# Patient Record
Sex: Female | Born: 1960 | Race: Asian | Hispanic: No | Marital: Married | Smoking: Never smoker
Health system: Southern US, Community
[De-identification: ages and names within clinical notes are randomized; demographics above are authoritative.]

## PROBLEM LIST (undated history)

## (undated) DIAGNOSIS — E785 Hyperlipidemia, unspecified: Secondary | ICD-10-CM

## (undated) HISTORY — DX: Hyperlipidemia, unspecified: E78.5

## (undated) HISTORY — PX: DILATION AND CURETTAGE, DIAGNOSTIC / THERAPEUTIC: SUR384

---

## 2014-01-14 DIAGNOSIS — N8501 Benign endometrial hyperplasia: Secondary | ICD-10-CM | POA: Insufficient documentation

## 2019-11-03 DIAGNOSIS — E559 Vitamin D deficiency, unspecified: Secondary | ICD-10-CM | POA: Insufficient documentation

## 2019-11-03 DIAGNOSIS — Z823 Family history of stroke: Secondary | ICD-10-CM | POA: Insufficient documentation

## 2019-11-03 DIAGNOSIS — E782 Mixed hyperlipidemia: Secondary | ICD-10-CM | POA: Insufficient documentation

## 2020-11-21 ENCOUNTER — Other Ambulatory Visit: Payer: Self-pay | Admitting: Internal Medicine

## 2020-11-21 DIAGNOSIS — R140 Abdominal distension (gaseous): Secondary | ICD-10-CM

## 2020-12-11 ENCOUNTER — Telehealth: Payer: Self-pay

## 2020-12-11 NOTE — Telephone Encounter (Signed)
Call placed to [facility] to obtain images/report prior to upcoming appointment. They have reported that their system is down with no estimated time on when it will be back up. At this time they cannot powershare/create a disc or provide a paper report. They can only get a radiologist to call a report.

## 2020-12-13 ENCOUNTER — Inpatient Hospital Stay: Payer: BC Managed Care – PPO

## 2020-12-13 ENCOUNTER — Other Ambulatory Visit: Payer: Self-pay

## 2020-12-13 ENCOUNTER — Inpatient Hospital Stay: Payer: BC Managed Care – PPO | Attending: Obstetrics and Gynecology | Admitting: Obstetrics and Gynecology

## 2020-12-13 VITALS — BP 125/80 | HR 71 | Temp 98.9°F | Resp 20 | Wt 189.4 lb

## 2020-12-13 DIAGNOSIS — R1900 Intra-abdominal and pelvic swelling, mass and lump, unspecified site: Secondary | ICD-10-CM

## 2020-12-13 DIAGNOSIS — R140 Abdominal distension (gaseous): Secondary | ICD-10-CM | POA: Insufficient documentation

## 2020-12-13 DIAGNOSIS — R971 Elevated cancer antigen 125 [CA 125]: Secondary | ICD-10-CM

## 2020-12-13 DIAGNOSIS — R1032 Left lower quadrant pain: Secondary | ICD-10-CM | POA: Diagnosis not present

## 2020-12-13 DIAGNOSIS — Z01818 Encounter for other preprocedural examination: Secondary | ICD-10-CM | POA: Insufficient documentation

## 2020-12-13 DIAGNOSIS — D3912 Neoplasm of uncertain behavior of left ovary: Secondary | ICD-10-CM | POA: Diagnosis present

## 2020-12-13 LAB — COMPREHENSIVE METABOLIC PANEL
ALT: 18 U/L (ref 0–44)
AST: 20 U/L (ref 15–41)
Albumin: 3.7 g/dL (ref 3.5–5.0)
Alkaline Phosphatase: 64 U/L (ref 38–126)
Anion gap: 8 (ref 5–15)
BUN: 11 mg/dL (ref 6–20)
CO2: 26 mmol/L (ref 22–32)
Calcium: 8.7 mg/dL — ABNORMAL LOW (ref 8.9–10.3)
Chloride: 106 mmol/L (ref 98–111)
Creatinine, Ser: 0.65 mg/dL (ref 0.44–1.00)
GFR, Estimated: >60 mL/min (ref >60)
Glucose, Bld: 86 mg/dL (ref 70–99)
Potassium: 4.2 mmol/L (ref 3.5–5.1)
Sodium: 140 mmol/L (ref 135–145)
Total Bilirubin: 0.8 mg/dL (ref 0.3–1.2)
Total Protein: 6.8 g/dL (ref 6.5–8.1)

## 2020-12-13 LAB — CBC WITH DIFFERENTIAL/PLATELET
Abs Immature Granulocytes: 0 10*3/uL (ref 0.00–0.07)
Basophils Absolute: 0 10*3/uL (ref 0.0–0.1)
Basophils Relative: 1 %
Eosinophils Absolute: 0.3 10*3/uL (ref 0.0–0.5)
Eosinophils Relative: 6 %
HCT: 41.6 % (ref 36.0–46.0)
Hemoglobin: 13.9 g/dL (ref 12.0–15.0)
Immature Granulocytes: 0 %
Lymphocytes Relative: 35 %
Lymphs Abs: 1.7 10*3/uL (ref 0.7–4.0)
MCH: 30.3 pg (ref 26.0–34.0)
MCHC: 33.4 g/dL (ref 30.0–36.0)
MCV: 90.8 fL (ref 80.0–100.0)
Monocytes Absolute: 0.4 10*3/uL (ref 0.1–1.0)
Monocytes Relative: 7 %
Neutro Abs: 2.6 10*3/uL (ref 1.7–7.7)
Neutrophils Relative %: 51 %
Platelets: 264 10*3/uL (ref 150–400)
RBC: 4.58 MIL/uL (ref 3.87–5.11)
RDW: 12.3 % (ref 11.5–15.5)
WBC: 5 10*3/uL (ref 4.0–10.5)
nRBC: 0 % (ref 0.0–0.2)

## 2020-12-13 NOTE — Progress Notes (Signed)
Called and spoke with [facility] again. They are still unable to powershare  CT or transfer images to disc. They are attempting to obtain a paper report. If this is successful they will fax to cancer center.

## 2020-12-13 NOTE — Patient Instructions (Signed)
[facility] will reach out to you with appointments regarding surgery

## 2020-12-13 NOTE — Progress Notes (Signed)
Gynecologic Oncology Consult Visit   Referring Provider: Rusty Aus, MD [facility] [place],  [place] [address] [phone]  Chief Concern: abdominal pain and ovarian mass  Subjective:  Sophia Stewart is a 60 y.o. P0 female who is seen in consultation from Dr. Sabra Heck for large abdominal pain and ovarian mass concerning for malignancy.  Sophia Stewart presented with a 6 month history of LLQ/suprapubic abdominal pain and known sigmoid diverticulosis to the GI clinic in 12/04/2020 for chief complaint of colon cancer screening, abdominal distention, and generalized abdominal pain. She reports she began having abdominal pain located in LLQ and suprapubic regions back in October which felt like menstrual cramps to her. She was evaluated by Nani Gasser in office 07/17/20 where she was empirically treated for diverticulitis with course of Augmentin 875 mg PO BID x10d. She reports abdominal pain symptoms resolved after antibiotics. She had recurrence of her lower abdominal pain (LLQ and suprapubic regions) last week and was seen by Dr. Sabra Heck 11/21/2020  where she was restarted on Augmentin and was scheduled for CT scan. She reports her abdominal pain has improved since starting antibiotics, but she also c/o abdominal distention and tightness. Of note she reports that 5 years ago patient was back home in [place] for abdominal pain where pelvic ultrasound reportedly showed "overgrowth on uterus." This was not followed up. She had one episode of vaginal bleeding several months ago but no bleeding since.  Pertinent positive symptoms include weight loss and cough; rectal bleeding and perirectal pruritus which she correlates with hemorrhoid symptoms. The rectal bleeding was just seen on the tissue paper when wiping. Last colonoscopy 08/2011 performed by Dr. Gustavo Lah showed sigmoid diverticulosis, 1 mm sessile polyp in ascending colon with path showing unremarkable colonic mucosa.     11/21/2020 - CXR obtained to evaluate cough and was negative  2/23?/2022 - exact date unknown. Verbal report and hand-written note only. Final report not available.  CT Abdomen and pelvis: Large abdominal cystic mass 31 cm concerning for cystic adenoma or cystic carcinoma. Solid masses either related to uterine or cystic tumors.   12/13/2020 - In-office pelvic US by Dr. Georga Bora, gynecology. She provided hand-written notes. Left ovary cystic mass multiple measurements one >10 cm. Right ovary hazy. EMS not given.   Tumor markers not obtained.   She presents today for evaluation.     Problem List: Patient Active Problem List   Diagnosis Date Noted  . Family history of stroke 11/03/2019  . Hyperlipidemia, mixed 11/03/2019  . Vitamin D deficiency 11/03/2019  . Endometrial hyperplasia without atypia, complex 01/14/2014    Past Medical History: Past Medical History:  Diagnosis Date  . Hyperlipidemia         Past Surgical History: Past Surgical History:  Procedure Laterality Date  . DILATION AND CURETTAGE, DIAGNOSTIC / THERAPEUTIC       Past Gynecologic History:  Post menopausal History of Abnormal pap: No Last pap: 01/2014 NILM/HRHPV negative  OB History:  OB History  Gravida Para Term Preterm AB Living  1 0     1 0  SAB IAB Ectopic Multiple Live Births               # Outcome Date GA Lbr Len/2nd Weight Sex Delivery Anes PTL Lv  1 AB             Family History: Family History  Problem Relation Age of Onset  . Stroke Mother   . Diabetes Mother   .  Diabetes Sister      Social History: Social History   Socioeconomic History  . Marital status: Married    Spouse name: Not on file  . Number of children: Not on file  . Years of education: Not on file  . Highest education level: Not on file  Occupational History  . Not on file  Tobacco Use  . Smoking status: Never Smoker  . Smokeless tobacco: Never Used  Vaping Use  . Vaping Use: Never used   Substance and Sexual Activity  . Alcohol use: Not Currently  . Drug use: Not Currently  . Sexual activity: Not Currently  Other Topics Concern  . Not on file  Social History Narrative  . Not on file   Social Determinants of Health   Financial Resource Strain: Not on file  Food Insecurity: Not on file  Transportation Needs: Not on file  Physical Activity: Not on file  Stress: Not on file  Social Connections: Not on file  Intimate Partner Violence: Not on file    Allergies: No Known Allergies  Current Medications: Current Outpatient Medications  Medication Sig Dispense Refill  . Lutein 6 MG CAPS Take 1 capsule by mouth daily.    Marland Kitchen PROCTO-MED HC 2.5 % rectal cream Apply topically 3 (three) times daily.     No current facility-administered medications for this visit.    Review of Systems General: weight loss o/w negative for fevers, or night sweats Skin: negative for changes in moles or sores or rash Eyes: negative for changes in vision HEENT: negative for change in hearing, tinnitus, voice changes Pulmonary: cough o/w negative for dyspnea, orthopnea, wheezing Cardiac: negative for palpitations, pain Gastrointestinal: narrowed stool caliber but sometime more normal stool; occasional BRBPR, o/w negative for nausea, vomiting, constipation, diarrhea, hematemesis Genitourinary/Sexual: urinary frequency o/w negative for dysuria, retention, hematuria, incontinence Ob/Gyn:  negative for abnormal bleeding, or pain Musculoskeletal: negative for pain, joint pain, back pain Hematology: negative for easy bruising, abnormal bleeding Neurologic/Psych: negative for headaches, seizures, paralysis, weakness, numbness  Objective:  Physical Examination:  BP 125/80   Pulse 71   Temp 98.9 F (37.2 C)   Resp 20   Wt 189 lb 6.4 oz (85.9 kg)   SpO2 100%    ECOG Performance Status: 1 - Symptomatic but completely ambulatory  GENERAL: Patient is a well appearing female in no acute  distress. Accompanied by husband. HEENT:  PERRL, neck supple with midline trachea. Thyroid without masses.  NODES:  No cervical, supraclavicular, axillary, or inguinal lymphadenopathy palpated.  LUNGS:  Clear to auscultation bilaterally.  No wheezes or rhonchi. HEART:  Regular rate and rhythm. No murmur appreciated. ABDOMEN:  Distended with firm nontender mass filling the pelvis and extending into the upper abdomen > 30 cm. No mobility.    MSK:  No focal spinal tenderness to palpation. Full range of motion bilaterally in the upper extremities. EXTREMITIES:  No peripheral edema.   SKIN:  Clear with no obvious rashes or skin changes.  NEURO:  Nonfocal. Well oriented.  Appropriate affect.  Pelvic: exam chaperoned by nurse;  Vulva: normal appearing vulva with no masses, tenderness or lesions; Vagina: normal vagina; Sophia Stewart: large mass filling the Sophia Stewart, L>R, smooth, fixed, no nodularity, nontender. Uterus: uterus is anteverted normal size, shape, consistency and nontender; Cervix: no lesions; Rectal: + hemorrhoids; confirmatory, no mucosal involvement.     Lab Review Labs on site today: CBC/CMP/CEA/CA125 ordered 11/16/2020  Vitamin B12  413 - normal TSH 3.548 - normal HbA1C - 5.5  normal           Radiologic Imaging: As per HPI. CT scan images and results requested    Assessment:  Amelda Hapke is a 60 y.o. female diagnosed with symptomatic large abdominal mass worrisome for ovarian malignancy. Differential diagnosis includes GI malignancy, endometrial cancer, or benign ovarian etiology.   History of thickened endometrial stripe, no recent symptoms of abnormal vaginal bleeding, [place] completed and results pending to assess EMS.   Sigmoid diverticulosis based on colonoscopy. Question of diverticulitis flares which has responded well to oral Augmentin antimicrobial therapy. There is no history of CT-confirmed episodes of diverticulitis.    Medical co-morbidities complicating care:  None  Plan:   Problem List Items Addressed This Visit   None   Visit Diagnoses    Pelvic mass    -  Primary   Relevant Orders   CA 125   CEA   Comprehensive metabolic panel   CBC with Differential/Platelet   IGP, Aptima HPV   EKG 12-Lead     We discussed options for management options and recommended surgery at [facility]. We reviewed that if she has malignancy recommendation for TH and BSO with staging. If nonmalignant hysterectomy does not need to be performed. She may need bowel surgery given symptoms and exam and we discussed possibly ostomy. We will assess surgical approach after review of CT images. If mostly cystic we can try controlled drainage with hand-assisted laparoscopy versus laparotomy. Given the immobility of the mass suspect laparotomy may be the approach.  Follow up pelvic US findings and if EMS thickened consider EMBx at her preop visit at [facility].     Try to obtain final reports for CT scan and pelvic US. Obtain EKG, CBC, CMP, CA125 and CEA.   The patient's diagnosis, an outline of the further diagnostic and laboratory studies which will be required, the recommendation, and alternatives were discussed.  All questions were answered to the patient's satisfaction.  A total of .120 minutes were spent with the patient/family today; >50% was spent in education, counseling and coordination of care for large pelvic mass.   [place], MD    CC:  Rusty Aus, MD [facility] [address] [place],  [place] [address] [phone]

## 2020-12-14 ENCOUNTER — Telehealth: Payer: Self-pay | Admitting: Obstetrics and Gynecology

## 2020-12-14 LAB — CEA: CEA: 5 ng/mL — ABNORMAL HIGH (ref 0.0–4.7)

## 2020-12-14 LAB — CA 125: Cancer Antigen (CA) 125: 310 U/mL — ABNORMAL HIGH (ref 0.0–38.1)

## 2020-12-14 NOTE — Telephone Encounter (Signed)
Called patient to notify her of EKG scheduled for tomorrow morning (3/4) at 9:30. Patient confirmed date, time and location.

## 2020-12-15 ENCOUNTER — Other Ambulatory Visit: Payer: Self-pay

## 2020-12-15 ENCOUNTER — Telehealth: Payer: Self-pay | Admitting: Nurse Practitioner

## 2020-12-15 ENCOUNTER — Ambulatory Visit
Admission: RE | Admit: 2020-12-15 | Discharge: 2020-12-15 | Disposition: A | Discharge status: Home / Self Care | Payer: BC Managed Care – PPO | Source: Ambulatory Visit | Attending: Internal Medicine | Admitting: Internal Medicine

## 2020-12-15 DIAGNOSIS — R1900 Intra-abdominal and pelvic swelling, mass and lump, unspecified site: Secondary | ICD-10-CM

## 2020-12-15 DIAGNOSIS — D3912 Neoplasm of uncertain behavior of left ovary: Secondary | ICD-10-CM | POA: Diagnosis not present

## 2020-12-15 NOTE — Telephone Encounter (Signed)
Left voicemail for patient to review bloodwork. Emailed results to Dr. Theora Gianotti. Appointment scheduled for EKG. No appointment seen yet for surgery at [facility].

## 2020-12-17 LAB — IGP, APTIMA HPV: HPV Aptima: NEGATIVE

## 2021-01-22 HISTORY — PX: ABDOMINAL HYSTERECTOMY: SHX81

## 2021-03-07 ENCOUNTER — Inpatient Hospital Stay: Payer: BC Managed Care – PPO | Attending: Obstetrics and Gynecology | Admitting: Obstetrics and Gynecology

## 2021-03-07 VITALS — BP 134/73 | HR 60 | Temp 97.8°F | Resp 20 | Wt 161.2 lb

## 2021-03-07 DIAGNOSIS — D270 Benign neoplasm of right ovary: Secondary | ICD-10-CM

## 2021-03-07 DIAGNOSIS — Z90710 Acquired absence of both cervix and uterus: Secondary | ICD-10-CM

## 2021-03-07 DIAGNOSIS — D279 Benign neoplasm of unspecified ovary: Secondary | ICD-10-CM | POA: Insufficient documentation

## 2021-03-07 DIAGNOSIS — Z90722 Acquired absence of ovaries, bilateral: Secondary | ICD-10-CM

## 2021-03-07 NOTE — Progress Notes (Signed)
Gynecologic Oncology Interval Visit   Referring Provider: Rusty Aus, MD [facility] [place],  [place] [address] [phone]  Chief Concern: postop visit  Subjective:  Sophia Stewart is a 60 y.o. P0 female who is seen in consultation from Dr. Sabra Heck for large abdominal pain and ovarian mass concerning for malignancy.  She open went hand-assisted L/S TLHBSO and removal >30 cm right adnexal mass on 01/22/2021. She has recovered from surgery well except she has hot flashes.    DIAGNOSIS    A.  Uterus, cervix, left fallopian tube, and left ovary, hysterectomy and left salpingo-oophorectomy:  Cervix: Negative for malignancy.   Endometrium: Inactive endometrium, negative for malignancy.   Myometrium: Leiomyomata, largest 7 cm.   Serosa: Negative for malignancy.   Left ovary: Negative for malignancy.   Left fallopian tube: Negative for malignancy.   B. 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 See comment.  Right fallopian tube: Negative for malignancy.   Comment: The capsule surface has deposits of acellular mucin but no tumor cells are identified.     Cytology: NEGATIVE. NO EVIDENCE OF MALIGNANCY.    Problem List: Patient Active Problem List   Diagnosis Date Noted  . 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 Family history of stroke 11/03/2019  . Hyperlipidemia, mixed 11/03/2019  . Vitamin D deficiency 11/03/2019  . Endometrial hyperplasia without atypia, complex 01/14/2014    Past Medical History: Past Medical History:  Diagnosis Date  . Hyperlipidemia         Past Surgical History: Past Surgical History:  Procedure Laterality Date  . ABDOMINAL HYSTERECTOMY  01/22/2021   TLHBSO resection >30 right ovarian cystic mass  . DILATION AND CURETTAGE, DIAGNOSTIC / THERAPEUTIC       Past Gynecologic History:  Post menopausal History of Abnormal  pap: No Last pap: 01/2014 NILM/HRHPV negative  OB History:  OB History  Gravida Para Term Preterm AB Living  1 0     1 0  SAB IAB Ectopic Multiple Live Births               # Outcome Date GA Lbr Len/2nd Weight Sex Delivery Anes PTL Lv  1 AB             Family History: Family History  Problem Relation Age of Onset  . Stroke Mother   . Diabetes Mother   . Diabetes Sister      Social History: Social History   Socioeconomic History  . Marital status: Married    Spouse name: Not on file  . Number of children: Not on file  . Years of education: Not on file  . Highest education level: Not on file  Occupational History  . Not on file  Tobacco Use  . Smoking status: Never Smoker  . Smokeless tobacco: Never Used  Vaping Use  . Vaping Use: Never used  Substance and Sexual Activity  . Alcohol use: Not Currently  . Drug use: Not Currently  . Sexual activity: Not Currently  Other Topics Concern  . Not on file  Social History Narrative  . Not on file   Social Determinants of Health   Financial Resource Strain: Not on file  Food Insecurity: Not on file  Transportation Needs: Not on file  Physical Activity: Not on file  Stress: Not on file  Social Connections: Not on file  Intimate Partner Violence: Not on file    Allergies: No  Known Allergies  Review of Systems General: no complaints  HEENT: no complaints  Lungs: no complaints  Cardiac: no complaints  GI: no complaints  GU: no complaints GYN: she does c/o hot flashes after surgery  Musculoskeletal: no complaints  Extremities: no complaints  Skin: no complaints  Neuro: no complaints  Endocrine: no complaints  Psych: no complaints       Objective:  Physical Examination:  BP 134/73   Pulse 60   Temp 97.8 F (36.6 C)   Resp 20   Wt 161 lb 3.2 oz (73.1 kg)   SpO2 100%    ECOG Performance Status: 0 - Asymptomatic  GENERAL: Patient is a well appearing female in no acute distress. Accompanied by  husband. ABDOMEN:  Soft nondistended and nontender. Incisions healed and no hernias   EXTREMITIES:  No peripheral edema.   NEURO:  Nonfocal. Well oriented.  Appropriate affect.  Pelvic: exam chaperoned by nurse;  Vulva: normal appearing vulva with no masses, tenderness or lesions; Vagina: normal vagina; cuff is healing. Sutures still present. Uterus/Cervix: absent; BME: negative for masses or nodularity.   Lab Review Labs reviewed  Radiologic Imaging: n/a    Assessment:  Sophia Stewart is a 60 y.o. female diagnosed with symptomatic large abdominal mass worrisome for ovarian malignancy s/p surgical management with benign disease. Excellent postop recovery.   Menopausal symptoms.    Medical co-morbidities complicating care: None  Plan:   Problem List Items Addressed This Visit      Other   Mucinous cystadenoma - Primary     Follow up in July 2022 to ensure vaginal cuff completely healed.  Reviewed pathology. Discussed why she is having the vasomotor symptoms which is surgical menopause. Recommended close follow up and no further treatment at this time. We will reassess at her next visit.   [place], MD    CC:  Rusty Aus, MD [facility] [place],  [place] [address] [phone]

## 2021-03-07 NOTE — Patient Instructions (Signed)
Continue to follow with your primary care physician.

## 2021-05-09 ENCOUNTER — Other Ambulatory Visit: Payer: Self-pay

## 2021-05-09 ENCOUNTER — Inpatient Hospital Stay: Payer: BC Managed Care – PPO | Attending: Obstetrics and Gynecology | Admitting: Obstetrics and Gynecology

## 2021-05-09 VITALS — BP 142/75 | HR 70 | Temp 97.8°F | Resp 20 | Wt 176.0 lb

## 2021-05-09 DIAGNOSIS — Z90710 Acquired absence of both cervix and uterus: Secondary | ICD-10-CM

## 2021-05-09 DIAGNOSIS — Z90722 Acquired absence of ovaries, bilateral: Secondary | ICD-10-CM

## 2021-05-09 DIAGNOSIS — D270 Benign neoplasm of right ovary: Secondary | ICD-10-CM

## 2021-05-09 DIAGNOSIS — D279 Benign neoplasm of unspecified ovary: Secondary | ICD-10-CM

## 2021-05-09 NOTE — Progress Notes (Signed)
Gynecologic Oncology Interval Visit   Referring Provider: Rusty Aus, MD [facility] [place],  [place] [address] [phone]  Chief Concern: postop visit  Subjective:  Sophia Stewart is a 60 y.o. P0 female who is seen in consultation from Dr. Sabra Heck for large abdominal pain and ovarian mass concerning for malignancy.  She open went hand-assisted L/S TLHBSO and removal >30 cm right adnexal mass on 01/22/2021. She has recovered from surgery and presents for vaginal cuff check. Hot flashes improved.    DIAGNOSIS    A.  Uterus, cervix, left fallopian tube, and left ovary, hysterectomy and left salpingo-oophorectomy:   Cervix: Negative for malignancy.    Endometrium: Inactive endometrium, negative for malignancy.    Myometrium: Leiomyomata, largest 7 cm.    Serosa: Negative for malignancy.    Left ovary: Negative for malignancy.    Left fallopian tube: Negative for malignancy.    B. 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   Right fallopian tube: Negative for malignancy.    Comment: The capsule surface has deposits of acellular mucin but no tumor cells are identified.      Cytology: NEGATIVE. NO EVIDENCE OF MALIGNANCY.    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   Family history of stroke 11/03/2019   Hyperlipidemia, mixed 11/03/2019   Vitamin D deficiency 11/03/2019   Endometrial hyperplasia without atypia, complex 01/14/2014    Past Medical History: Past Medical History:  Diagnosis Date   Hyperlipidemia         Past Surgical History: Past Surgical History:  Procedure Laterality Date   ABDOMINAL HYSTERECTOMY  01/22/2021   TLHBSO resection >30 right ovarian cystic mass   DILATION AND CURETTAGE, DIAGNOSTIC / THERAPEUTIC       Past Gynecologic History:  Post  menopausal History of Abnormal pap: No Last pap: 01/2014 NILM/HRHPV negative  OB History:  OB History  Gravida Para Term Preterm AB Living  1 0     1 0  SAB IAB Ectopic Multiple Live Births               # Outcome Date GA Lbr Len/2nd Weight Sex Delivery Anes PTL Lv  1 AB             Family History: Family History  Problem Relation Age of Onset   Stroke Mother    Diabetes Mother    Diabetes Sister      Social History: Social History   Socioeconomic History   Marital status: Married    Spouse name: Not on file   Number of children: Not on file   Years of education: Not on file   Highest education level: Not on file  Occupational History   Not on file  Tobacco Use   Smoking status: Never   Smokeless tobacco: Never  Vaping Use   Vaping Use: Never used  Substance and Sexual Activity   Alcohol use: Not Currently   Drug use: Not Currently   Sexual activity: Not Currently  Other Topics Concern   Not on file  Social History Narrative   Not on file   Social Determinants of Health   Financial Resource Strain: Not on file  Food Insecurity: Not on file  Transportation Needs: Not on file  Physical Activity: Not on file  Stress: Not on file  Social Connections: Not on file  Intimate Partner Violence: Not on file   Current Outpatient Medications on File Prior to Visit  Medication Sig Dispense Refill   Lutein 6 MG CAPS Take 1 capsule by mouth daily.     PROCTO-MED HC 2.5 % rectal cream Apply topically 3 (three) times daily. (Patient not taking: Reported on 05/09/2021)     No current facility-administered medications on file prior to visit.     Allergies: No Known Allergies  Review of Systems General: no complaints  HEENT: no complaints  Lungs: no complaints  Cardiac: no complaints  GI: no complaints  GU: no complaints GYN: no complaints  Musculoskeletal: no complaints  Extremities: no complaints  Skin: no complaints  Neuro: no complaints  Endocrine: no  complaints  Psych: no complaints       Objective:  Physical Examination:  BP (!) 142/75   Pulse 70   Temp 97.8 F (36.6 C)   Resp 20   Wt 176 lb (79.8 kg)   SpO2 100%    Performance status: 0  GENERAL: Patient is a well appearing female in no acute distress HEENT:  PERRL, neck supple with midline trachea.  ABDOMEN:  Soft, nontender and nondistended. Incisions all well healed and no hernias.    NEURO:  Nonfocal. Well oriented.  Appropriate affect.  Pelvic: chaperoned by CMA EGBUS: no lesions Cervix: surgically absent  Vagina: no lesions, no discharge or bleeding; a few sutures are still present.  Uterus: surgically absent  BME: no palpable masses   Radiologic Imaging: n/a    Assessment:  Sophia Stewart is a 60 y.o. female diagnosed with s/p surgical management for large pelvic mass pathology benign c/w mucinous cystadenoma.  Menopausal symptoms improved, declined ERT   Medical co-morbidities complicating care: None  Plan:   Problem List Items Addressed This Visit       Other   Mucinous cystadenoma - Primary    Discussed sexual function (risks of vaginal cuff separation and how to reduce this occurrence) and reviewed the surgical procedure. Recommended waiting one more month before intercourse as sutures still present. Released from clinic and recommended that she follow up with PCP.   Dracen Reigle Gaetana Michaelis, MD

## 2021-11-23 ENCOUNTER — Other Ambulatory Visit: Payer: Self-pay | Admitting: Internal Medicine

## 2021-11-23 DIAGNOSIS — Z1231 Encounter for screening mammogram for malignant neoplasm of breast: Secondary | ICD-10-CM

## 2022-01-01 ENCOUNTER — Other Ambulatory Visit: Payer: Self-pay

## 2022-01-01 ENCOUNTER — Ambulatory Visit
Admission: RE | Admit: 2022-01-01 | Discharge: 2022-01-01 | Disposition: A | Discharge status: Home / Self Care | Payer: BC Managed Care – PPO | Source: Ambulatory Visit | Attending: Internal Medicine | Admitting: Internal Medicine

## 2022-01-01 DIAGNOSIS — Z1231 Encounter for screening mammogram for malignant neoplasm of breast: Secondary | ICD-10-CM | POA: Diagnosis present

## 2022-01-03 ENCOUNTER — Inpatient Hospital Stay
Admission: RE | Admit: 2022-01-03 | Discharge: 2022-01-03 | Disposition: A | Discharge status: Home / Self Care | Payer: Self-pay | Source: Ambulatory Visit | Attending: *Deleted | Admitting: *Deleted

## 2022-01-03 ENCOUNTER — Other Ambulatory Visit: Payer: Self-pay | Admitting: *Deleted

## 2022-01-03 DIAGNOSIS — Z1231 Encounter for screening mammogram for malignant neoplasm of breast: Secondary | ICD-10-CM

## 2023-02-19 ENCOUNTER — Other Ambulatory Visit: Payer: Self-pay

## 2023-02-19 DIAGNOSIS — Z1231 Encounter for screening mammogram for malignant neoplasm of breast: Secondary | ICD-10-CM

## 2023-03-26 ENCOUNTER — Ambulatory Visit
Admission: RE | Admit: 2023-03-26 | Discharge: 2023-03-26 | Disposition: A | Discharge status: Home / Self Care | Payer: BC Managed Care – PPO | Source: Ambulatory Visit | Attending: Internal Medicine | Admitting: Internal Medicine

## 2023-03-26 DIAGNOSIS — Z1231 Encounter for screening mammogram for malignant neoplasm of breast: Secondary | ICD-10-CM | POA: Diagnosis present

## 2023-03-26 IMAGING — MG MM DIGITAL SCREENING BILAT W/ TOMO AND CAD
6 of 10 series · 6 of 30 positions shown · non-contrast
Comparison: Previous exam(s).

CLINICAL DATA: Screening.

EXAM:
DIGITAL SCREENING BILATERAL MAMMOGRAM WITH TOMOSYNTHESIS AND CAD
TECHNIQUE: Bilateral screening digital craniocaudal and mediolateral oblique
mammograms were obtained. Bilateral screening digital breast
tomosynthesis was performed. The images were evaluated with
computer-aided detection.

[L CC synth-2D]
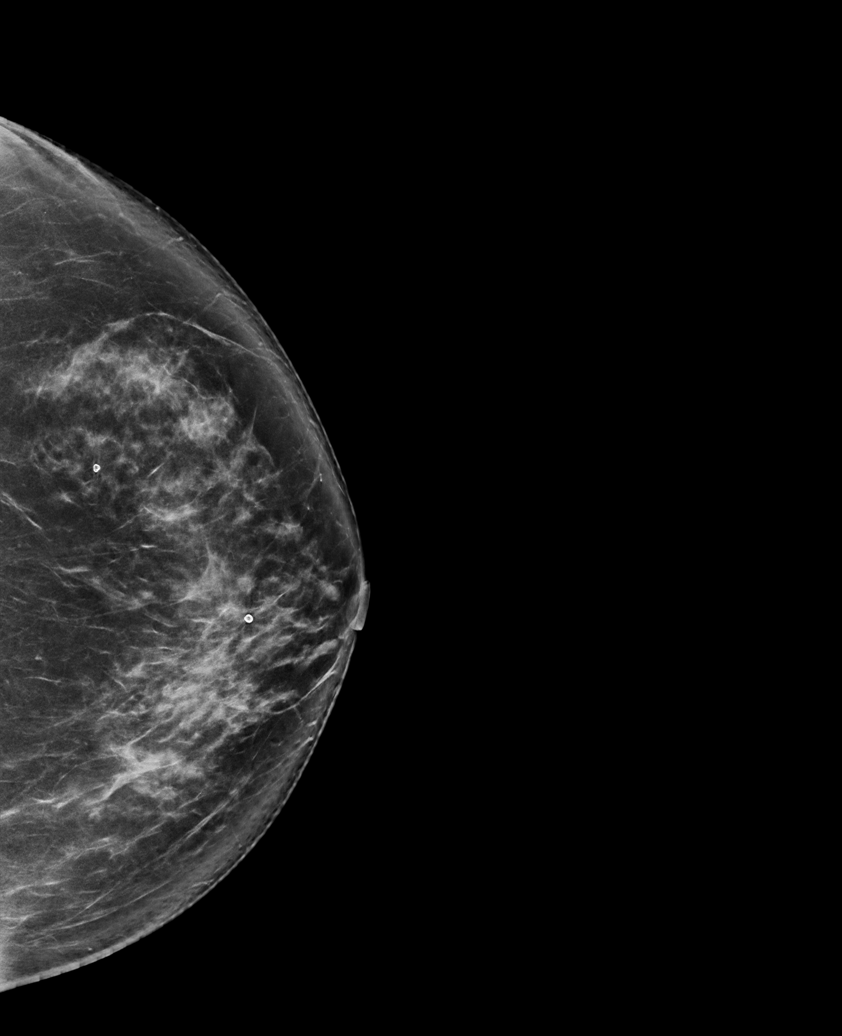

[L MLO synth-2D]
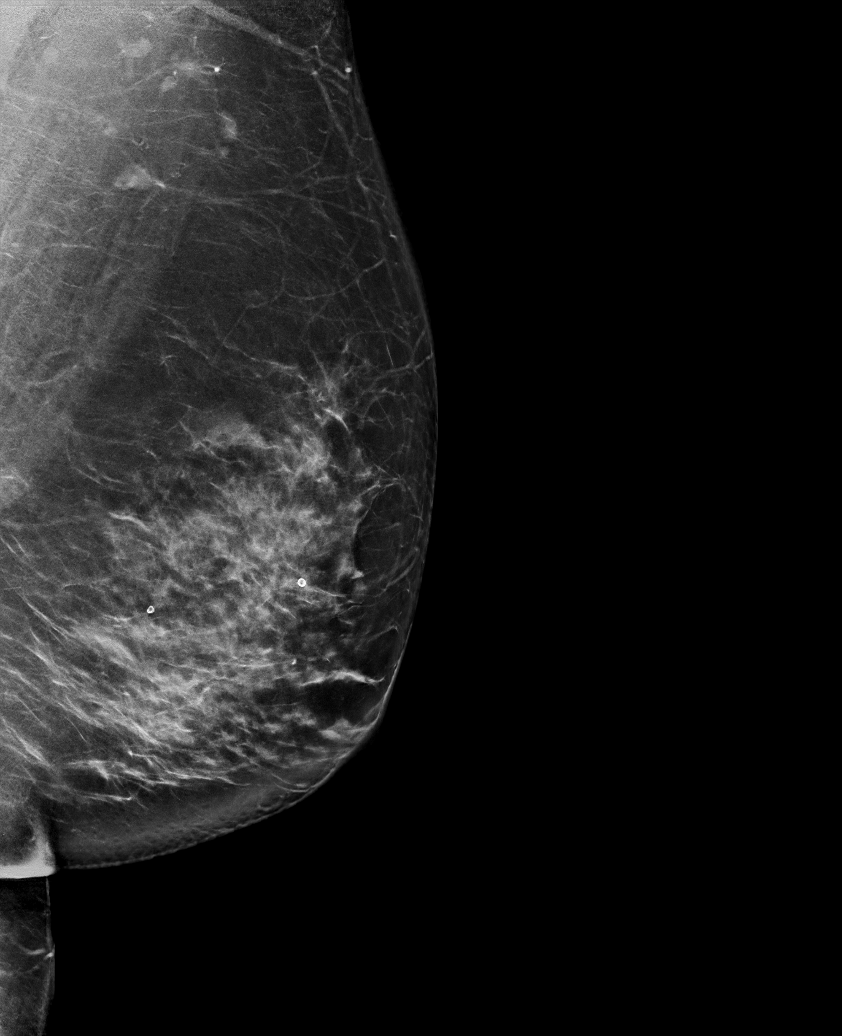

[R CC synth-2D (1 of 2)]
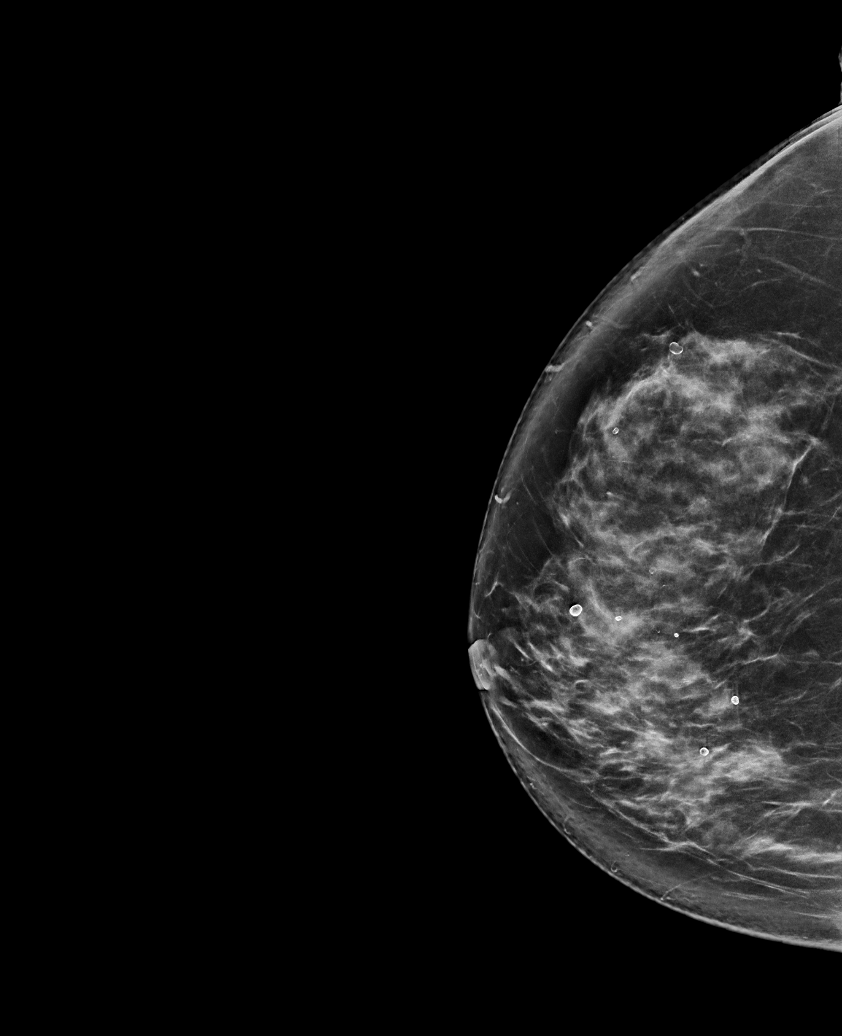

[R MLO synth-2D]
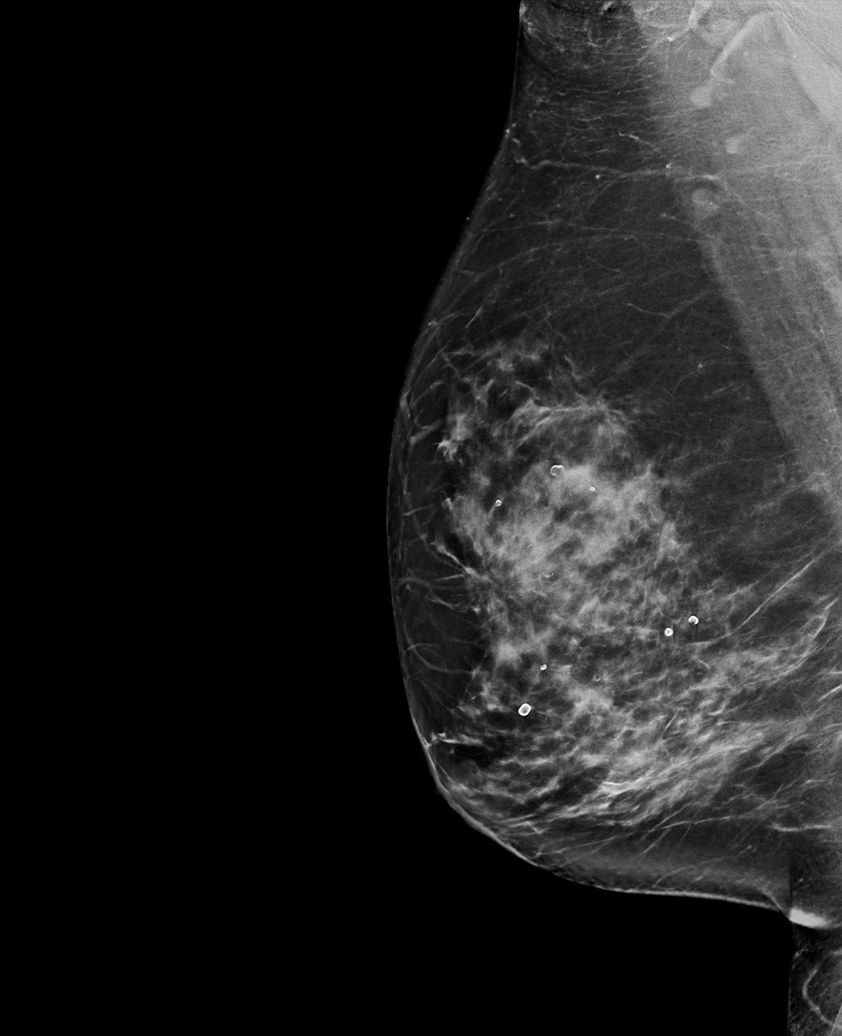

[R CC synth-2D (2 of 2)]
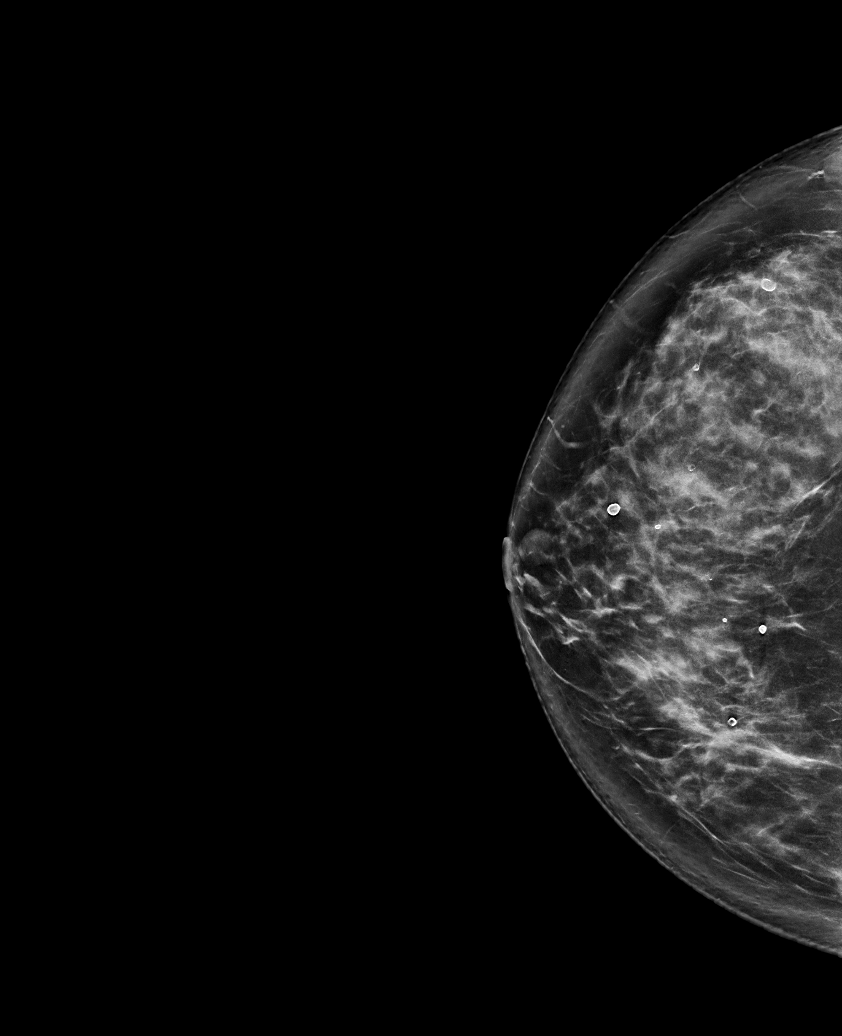

[R MLO tomo · tomo slice 50/99.0]
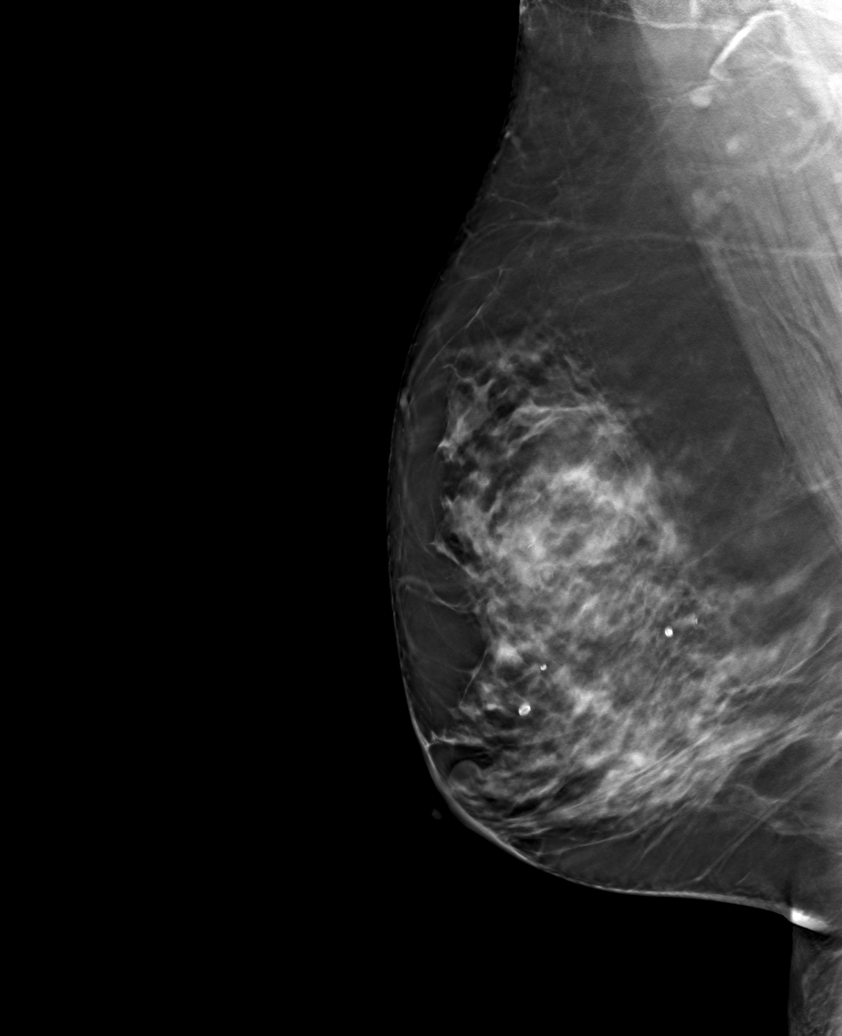

[6 of 30 positions shown; findings below may reference images not displayed]

ACR Breast Density Category c: The breast tissue is heterogeneously
dense, which may obscure small masses.
FINDINGS: There are no findings suspicious for malignancy.
IMPRESSION: No mammographic evidence of malignancy. A result letter of this
screening mammogram will be mailed directly to the patient.

RECOMMENDATION:
Screening mammogram in one year. (Code:Q3-W-BC3)

BI-RADS CATEGORY  1: Negative.

## 2024-05-31 ENCOUNTER — Other Ambulatory Visit: Payer: Self-pay | Admitting: Internal Medicine

## 2024-05-31 DIAGNOSIS — Z1231 Encounter for screening mammogram for malignant neoplasm of breast: Secondary | ICD-10-CM

## 2024-06-21 ENCOUNTER — Encounter

## 2024-06-24 ENCOUNTER — Ambulatory Visit
Admission: RE | Admit: 2024-06-24 | Discharge: 2024-06-24 | Disposition: A | Discharge status: Home / Self Care | Source: Ambulatory Visit | Attending: Internal Medicine | Admitting: Internal Medicine

## 2024-06-24 DIAGNOSIS — Z1231 Encounter for screening mammogram for malignant neoplasm of breast: Secondary | ICD-10-CM | POA: Insufficient documentation
